# Patient Record
Sex: Female | Born: 1940 | Race: White | Hispanic: No | Marital: Single | Smoking: Current every day smoker
Health system: Southern US, Community
[De-identification: ages and names within clinical notes are randomized; demographics above are authoritative.]

## PROBLEM LIST (undated history)

## (undated) DIAGNOSIS — K759 Inflammatory liver disease, unspecified: Secondary | ICD-10-CM

## (undated) DIAGNOSIS — I639 Cerebral infarction, unspecified: Secondary | ICD-10-CM

## (undated) DIAGNOSIS — I219 Acute myocardial infarction, unspecified: Secondary | ICD-10-CM

## (undated) DIAGNOSIS — I2510 Atherosclerotic heart disease of native coronary artery without angina pectoris: Secondary | ICD-10-CM

## (undated) DIAGNOSIS — R200 Anesthesia of skin: Secondary | ICD-10-CM

## (undated) DIAGNOSIS — J449 Chronic obstructive pulmonary disease, unspecified: Secondary | ICD-10-CM

## (undated) DIAGNOSIS — K922 Gastrointestinal hemorrhage, unspecified: Secondary | ICD-10-CM

## (undated) DIAGNOSIS — I10 Essential (primary) hypertension: Secondary | ICD-10-CM

## (undated) HISTORY — PX: CARDIAC DEFIBRILLATOR PLACEMENT: SHX171

## (undated) HISTORY — PX: OTHER SURGICAL HISTORY: SHX169

## (undated) HISTORY — PX: PACEMAKER INSERTION: SHX728

## (undated) HISTORY — PX: CORONARY ARTERY BYPASS GRAFT: SHX141

## (undated) HISTORY — PX: CHOLECYSTECTOMY: SHX55

---

## 2011-04-10 ENCOUNTER — Inpatient Hospital Stay: Payer: Self-pay | Admitting: Internal Medicine

## 2012-01-10 DIAGNOSIS — K922 Gastrointestinal hemorrhage, unspecified: Secondary | ICD-10-CM

## 2012-01-10 HISTORY — DX: Gastrointestinal hemorrhage, unspecified: K92.2

## 2012-02-09 ENCOUNTER — Encounter (HOSPITAL_BASED_OUTPATIENT_CLINIC_OR_DEPARTMENT_OTHER): Payer: Self-pay | Admitting: *Deleted

## 2012-02-09 ENCOUNTER — Emergency Department (HOSPITAL_BASED_OUTPATIENT_CLINIC_OR_DEPARTMENT_OTHER)
Admission: EM | Admit: 2012-02-09 | Discharge: 2012-02-09 | Disposition: A | Discharge status: Home / Self Care | Payer: Medicare Other | Attending: Emergency Medicine | Admitting: Emergency Medicine

## 2012-02-09 ENCOUNTER — Emergency Department (HOSPITAL_BASED_OUTPATIENT_CLINIC_OR_DEPARTMENT_OTHER): Payer: Medicare Other

## 2012-02-09 DIAGNOSIS — L0390 Cellulitis, unspecified: Secondary | ICD-10-CM

## 2012-02-09 DIAGNOSIS — F17200 Nicotine dependence, unspecified, uncomplicated: Secondary | ICD-10-CM | POA: Insufficient documentation

## 2012-02-09 DIAGNOSIS — J449 Chronic obstructive pulmonary disease, unspecified: Secondary | ICD-10-CM | POA: Insufficient documentation

## 2012-02-09 DIAGNOSIS — L02419 Cutaneous abscess of limb, unspecified: Secondary | ICD-10-CM | POA: Insufficient documentation

## 2012-02-09 DIAGNOSIS — Z7982 Long term (current) use of aspirin: Secondary | ICD-10-CM | POA: Insufficient documentation

## 2012-02-09 DIAGNOSIS — Z951 Presence of aortocoronary bypass graft: Secondary | ICD-10-CM | POA: Insufficient documentation

## 2012-02-09 DIAGNOSIS — I2510 Atherosclerotic heart disease of native coronary artery without angina pectoris: Secondary | ICD-10-CM | POA: Insufficient documentation

## 2012-02-09 DIAGNOSIS — I252 Old myocardial infarction: Secondary | ICD-10-CM | POA: Insufficient documentation

## 2012-02-09 DIAGNOSIS — Z8679 Personal history of other diseases of the circulatory system: Secondary | ICD-10-CM | POA: Insufficient documentation

## 2012-02-09 DIAGNOSIS — E119 Type 2 diabetes mellitus without complications: Secondary | ICD-10-CM | POA: Insufficient documentation

## 2012-02-09 DIAGNOSIS — Z79899 Other long term (current) drug therapy: Secondary | ICD-10-CM | POA: Insufficient documentation

## 2012-02-09 DIAGNOSIS — J4489 Other specified chronic obstructive pulmonary disease: Secondary | ICD-10-CM | POA: Insufficient documentation

## 2012-02-09 HISTORY — DX: Gastrointestinal hemorrhage, unspecified: K92.2

## 2012-02-09 HISTORY — DX: Essential (primary) hypertension: I10

## 2012-02-09 HISTORY — DX: Inflammatory liver disease, unspecified: K75.9

## 2012-02-09 HISTORY — DX: Cerebral infarction, unspecified: I63.9

## 2012-02-09 HISTORY — DX: Acute myocardial infarction, unspecified: I21.9

## 2012-02-09 HISTORY — DX: Chronic obstructive pulmonary disease, unspecified: J44.9

## 2012-02-09 HISTORY — DX: Atherosclerotic heart disease of native coronary artery without angina pectoris: I25.10

## 2012-02-09 HISTORY — DX: Anesthesia of skin: R20.0

## 2012-02-09 MED ORDER — CEPHALEXIN 500 MG PO CAPS: 500.0000 mg | ORAL_CAPSULE | Freq: Four times a day (QID) | ORAL | Status: AC

## 2012-02-09 MED ORDER — CEPHALEXIN 250 MG PO CAPS
500.0000 mg | ORAL_CAPSULE | Freq: Once | ORAL | Status: AC
Start: 2012-02-09 — End: 2012-02-09
  Administered 2012-02-09: 500 mg via ORAL
  Filled 2012-02-09: qty 2

## 2012-02-09 MED ORDER — SULFAMETHOXAZOLE-TMP DS 800-160 MG PO TABS
2.0000 | ORAL_TABLET | Freq: Once | ORAL | Status: AC
  Administered 2012-02-09: 2 via ORAL
  Filled 2012-02-09: qty 2

## 2012-02-09 MED ORDER — SULFAMETHOXAZOLE-TMP DS 800-160 MG PO TABS: 2.0000 | ORAL_TABLET | Freq: Two times a day (BID) | ORAL | Status: AC

## 2012-02-09 NOTE — ED Notes (Signed)
Pain and swelling left lower leg for the last 2 days.  Moderate swelling in foot, with redden area medical lower leg.  Recent discharge from hospital.

## 2012-02-09 NOTE — ED Provider Notes (Signed)
History     CSN: [id number]  Arrival date & time 02/09/12  1713   First MD Initiated Contact with Patient 02/09/12 1813      Chief Complaint  Patient presents with  . Leg Swelling    left    (Consider location/radiation/quality/duration/timing/severity/associated sxs/prior treatment) HPI Patient is a 71 year old female who presents today complaining of 8/10 left lower extremity pain from the knee distally down into the foot. Patient has no history of injury or DVT or PE. She was recently hospitalized and discharged 2 weeks ago for an episode of GI bleeding. She's been told that polyps are found on the colonoscopy but that these were noncancerous. She denies any history of cellulitis or boils. Patient has no history of diabetes. She reports that the pain is worse with palpation of the foot as well as the left medial calf where patient has a small area slightly erythematous area that is 2.5" x 1". Patient lives in [place] and drove 210 miles yesterday to arrive here. Her son forced her to come in as he was worried about a blood clot since he has a history of both DVT and PE. The patient is not on any hormones.  Patient denies any chest pain or shortness of breath. There are no other associated modifying factors. Past Medical History  Diagnosis Date  . Coronary artery disease   . MI (myocardial infarction)     7-8 heart attacks  . Hypertension   . COPD (chronic obstructive pulmonary disease)   . GI bleed July 2013    [place], [place]  . Hepatitis     age16  . Diabetes mellitus   . Stroke   . Left sided numbness     Past Surgical History  Procedure Date  . Coronary artery bypass graft   . Cardiac stents   . Cholecystectomy   . Cardiac defibrillator placement   . Pacemaker insertion     History reviewed. No pertinent family history.  History  Substance Use Topics  . Smoking status: Current Everyday Smoker -- 1.5 packs/day for 57 years    Types: Cigarettes  . Smokeless  tobacco: Not on file  . Alcohol Use: No    OB History    Grav Para Term Preterm Abortions TAB SAB Ect Mult Living                  Review of Systems  Constitutional: Negative.   HENT: Negative.   Eyes: Negative.   Respiratory: Negative.   Cardiovascular: Negative.   Gastrointestinal: Negative.   Genitourinary: Negative.   Musculoskeletal:       Left lower extremity pain  Neurological: Negative.   Hematological: Negative.   Psychiatric/Behavioral: Negative.   All other systems reviewed and are negative.    Allergies  Codeine and Morphine and related  Home Medications   Current Outpatient Rx  Name Route Sig Dispense Refill  . ALBUTEROL SULFATE (2.5 MG/3ML) 0.083% IN NEBU Nebulization Take 2.5 mg by nebulization every 6 (six) hours as needed. For wheezing and shortness of breath.    . ASPIRIN 81 MG PO TABS Oral Take 81 mg by mouth daily.    Marland Kitchen CARVEDILOL 25 MG PO TABS Oral Take 25 mg by mouth 2 (two) times daily with a meal.    . CLOPIDOGREL BISULFATE 75 MG PO TABS Oral Take 75 mg by mouth daily.    . ENALAPRIL MALEATE 20 MG PO TABS Oral Take 20 mg by mouth daily.    Marland Kitchen  FLUTICASONE-SALMETEROL 100-50 MCG/DOSE IN AEPB Inhalation Inhale 2 puffs into the lungs every 12 (twelve) hours. For wheezing.    Marland Kitchen METFORMIN HCL 1000 MG PO TABS Oral Take 1,000 mg by mouth 2 (two) times daily with a meal.    . PANTOPRAZOLE SODIUM 40 MG PO TBEC Oral Take 40 mg by mouth 2 (two) times daily.     Marland Kitchen POTASSIUM CHLORIDE CRYS ER 20 MEQ PO TBCR Oral Take 40 mEq by mouth 2 (two) times daily.    Marland Kitchen TIOTROPIUM BROMIDE MONOHYDRATE 18 MCG IN CAPS Inhalation Place 18 mcg into inhaler and inhale daily.      BP 101/42  Pulse 70  Temp 98.4 F (36.9 C) (Oral)  Resp 20  Ht 5\' 5"  (1.651 m)  Wt 178 lb (80.74 kg)  BMI 29.62 kg/m2  SpO2 100%  Physical Exam  Nursing note and vitals reviewed. GEN: Well-developed, well-nourished female in no distress HEENT: Atraumatic, normocephalic. Oropharynx clear  without erythema EYES: PERRLA BL, no scleral icterus. NECK: Trachea midline, no meningismus CV: regular rate and rhythm. No murmurs, rubs, or gallops PULM: No respiratory distress.  No crackles, wheezes, or rales. GI: soft, non-tender. No guarding, rebound, or tenderness. + bowel sounds  GU: deferred MSK: Patient has tenderness to palpation over the left medial calf just distal to the knee over an area that is slightly erythematous and 2.5" x 1". Patient also has tenderness to palpation over the dorsum of the left foot. Dorsalis pedis and posterior tibialis pulses are one plus. There is minimal swelling compared to other lower extremity. Patient also complains of some posterior left calf tenderness but has no palpable cord. Skin: No petechiae, purpura, or jaundice. Patient with small erythematous area noted over the left medial calf as noted in the musculoskeletal exam. Psych: no abnormality of mood   ED Course  Procedures (including critical care time)  Labs Reviewed - No data to display US Venous Img Lower Unilateral Left  02/09/2012  *RADIOLOGY REPORT*  Clinical Data: Left lower leg pain and swelling for 2 days.  LEFT LOWER EXTREMITY VENOUS DUPLEX ULTRASOUND  Technique:  Gray-scale sonography with graded compression, as well as color Doppler and duplex ultrasound, were performed to evaluate the deep venous system of the lower extremity from the level of the common femoral vein through the popliteal and proximal calf veins. Spectral Doppler was utilized to evaluate flow at rest and with distal augmentation maneuvers.  Comparison:  None.  Findings: There is no evidence of DVT in the lower extremity. Normal phasicity, augmentation, and compressibility in the saphenofemoral junction, common femoral vein, femoral vein, deep femoral vein, and popliteal vein.  IMPRESSION: Negative left lower extremity DVT study.  Original Report Authenticated By: Andreas Newport, M.D.     1. Cellulitis       MDM   Patient was evaluated by myself. Based on evaluation patient was not having chest pain or shortness of breath and she was hemodynamically stable. Ultrasound of the left lower short he was performed to evaluate for DVT. The area was minimally concerning for cellulitis but given its appearance this is the most likely other diagnosis. Ultrasound returned with no evidence of DVT. Patient will be treated for cellulitis. She was given her first dose of Keflex and Bactrim here. She was discharged with 10 days of both. Patient was advised to followup with her primary care physician. If she develops worsening of her symptoms or fevers, nausea, or vomiting despite antibiotics she should return to the emergency  department to be seen.        Cyndra Numbers, MD 02/11/12 0500

## 2014-07-09 ENCOUNTER — Emergency Department (HOSPITAL_BASED_OUTPATIENT_CLINIC_OR_DEPARTMENT_OTHER)
Admission: EM | Admit: 2014-07-09 | Discharge: 2014-07-09 | Disposition: A | Discharge status: Home / Self Care | Payer: Medicare Other | Attending: Emergency Medicine | Admitting: Emergency Medicine

## 2014-07-09 ENCOUNTER — Encounter (HOSPITAL_BASED_OUTPATIENT_CLINIC_OR_DEPARTMENT_OTHER): Payer: Self-pay | Admitting: *Deleted

## 2014-07-09 ENCOUNTER — Emergency Department (HOSPITAL_BASED_OUTPATIENT_CLINIC_OR_DEPARTMENT_OTHER): Payer: Medicare Other

## 2014-07-09 DIAGNOSIS — Z7951 Long term (current) use of inhaled steroids: Secondary | ICD-10-CM | POA: Insufficient documentation

## 2014-07-09 DIAGNOSIS — I2510 Atherosclerotic heart disease of native coronary artery without angina pectoris: Secondary | ICD-10-CM | POA: Insufficient documentation

## 2014-07-09 DIAGNOSIS — S0990XA Unspecified injury of head, initial encounter: Secondary | ICD-10-CM | POA: Insufficient documentation

## 2014-07-09 DIAGNOSIS — S0003XA Contusion of scalp, initial encounter: Secondary | ICD-10-CM | POA: Diagnosis not present

## 2014-07-09 DIAGNOSIS — M542 Cervicalgia: Secondary | ICD-10-CM

## 2014-07-09 DIAGNOSIS — Y9289 Other specified places as the place of occurrence of the external cause: Secondary | ICD-10-CM | POA: Insufficient documentation

## 2014-07-09 DIAGNOSIS — Z8673 Personal history of transient ischemic attack (TIA), and cerebral infarction without residual deficits: Secondary | ICD-10-CM | POA: Insufficient documentation

## 2014-07-09 DIAGNOSIS — I10 Essential (primary) hypertension: Secondary | ICD-10-CM | POA: Insufficient documentation

## 2014-07-09 DIAGNOSIS — Z8719 Personal history of other diseases of the digestive system: Secondary | ICD-10-CM | POA: Insufficient documentation

## 2014-07-09 DIAGNOSIS — Z720 Tobacco use: Secondary | ICD-10-CM | POA: Diagnosis not present

## 2014-07-09 DIAGNOSIS — Z7982 Long term (current) use of aspirin: Secondary | ICD-10-CM | POA: Insufficient documentation

## 2014-07-09 DIAGNOSIS — I252 Old myocardial infarction: Secondary | ICD-10-CM | POA: Insufficient documentation

## 2014-07-09 DIAGNOSIS — Z95810 Presence of automatic (implantable) cardiac defibrillator: Secondary | ICD-10-CM | POA: Diagnosis not present

## 2014-07-09 DIAGNOSIS — E119 Type 2 diabetes mellitus without complications: Secondary | ICD-10-CM | POA: Diagnosis not present

## 2014-07-09 DIAGNOSIS — Z9889 Other specified postprocedural states: Secondary | ICD-10-CM | POA: Diagnosis not present

## 2014-07-09 DIAGNOSIS — J449 Chronic obstructive pulmonary disease, unspecified: Secondary | ICD-10-CM | POA: Diagnosis not present

## 2014-07-09 DIAGNOSIS — Z79899 Other long term (current) drug therapy: Secondary | ICD-10-CM | POA: Insufficient documentation

## 2014-07-09 DIAGNOSIS — S199XXA Unspecified injury of neck, initial encounter: Secondary | ICD-10-CM | POA: Diagnosis not present

## 2014-07-09 DIAGNOSIS — Z7902 Long term (current) use of antithrombotics/antiplatelets: Secondary | ICD-10-CM | POA: Diagnosis not present

## 2014-07-09 DIAGNOSIS — Y9389 Activity, other specified: Secondary | ICD-10-CM | POA: Insufficient documentation

## 2014-07-09 DIAGNOSIS — W1789XA Other fall from one level to another, initial encounter: Secondary | ICD-10-CM | POA: Diagnosis not present

## 2014-07-09 DIAGNOSIS — Z955 Presence of coronary angioplasty implant and graft: Secondary | ICD-10-CM | POA: Insufficient documentation

## 2014-07-09 DIAGNOSIS — Y998 Other external cause status: Secondary | ICD-10-CM | POA: Insufficient documentation

## 2014-07-09 LAB — BASIC METABOLIC PANEL
ANION GAP: 9 (ref 5–15)
BUN: 16 mg/dL (ref 6–23)
CALCIUM: 9.2 mg/dL (ref 8.4–10.5)
CO2: 33 mmol/L — AB (ref 19–32)
CREATININE: 0.98 mg/dL (ref 0.50–1.10)
Chloride: 95 mEq/L — ABNORMAL LOW (ref 96–112)
GFR calc Af Amer: 65 mL/min — ABNORMAL LOW (ref >90)
GFR calc non Af Amer: 56 mL/min — ABNORMAL LOW (ref >90)
GLUCOSE: 128 mg/dL — AB (ref 70–99)
Potassium: 4 mmol/L (ref 3.5–5.1)
Sodium: 137 mmol/L (ref 135–145)

## 2014-07-09 LAB — CBC
HCT: 37.9 % (ref 36.0–46.0)
Hemoglobin: 12.1 g/dL (ref 12.0–15.0)
MCH: 27.8 pg (ref 26.0–34.0)
MCHC: 31.9 g/dL (ref 30.0–36.0)
MCV: 86.9 fL (ref 78.0–100.0)
PLATELETS: 303 10*3/uL (ref 150–400)
RBC: 4.36 MIL/uL (ref 3.87–5.11)
RDW: 16.8 % — ABNORMAL HIGH (ref 11.5–15.5)
WBC: 10.1 10*3/uL (ref 4.0–10.5)

## 2014-07-09 MED ORDER — HYDROCODONE-ACETAMINOPHEN 5-325 MG PO TABS
1.0000 | ORAL_TABLET | Freq: Once | ORAL | Status: AC
  Administered 2014-07-09: 1 via ORAL
  Filled 2014-07-09: qty 2

## 2014-07-09 NOTE — ED Notes (Signed)
MD at bedside. 

## 2014-07-09 NOTE — ED Notes (Signed)
Pt family member to desk-states pt did not want pain med earlier but is requesting med now-states pt takes percocet for chronic pain-EDP notified

## 2014-07-09 NOTE — ED Notes (Signed)
Pt given ice pack for forehead-states she doubts she will use it

## 2014-07-09 NOTE — ED Notes (Signed)
Pt refused 2 tabs hydrocodone-stating she only wanted to take one tab on an empty stomach-2nd tab returned to pyxis

## 2014-07-09 NOTE — Discharge Instructions (Signed)
Use ice for swelling, tylenol for pain.  If you were given medicines take as directed.  If you are on coumadin or contraceptives realize their levels and effectiveness is altered by many different medicines.  If you have any reaction (rash, tongues swelling, other) to the medicines stop taking and see a physician.   Please follow up as directed and return to the ER or see a physician for new or worsening symptoms.  Thank you. Filed Vitals:   07/09/14 1852  BP: 178/82  Pulse: 80  Temp: 97.9 F (36.6 C)  Resp: 16  Height: 5\' 5"  (1.651 m)  Weight: 190 lb (86.183 kg)  SpO2: 92%

## 2014-07-09 NOTE — ED Provider Notes (Signed)
CSN: [id number]     Arrival date & time 07/09/14  1848 History  This chart was scribed for Enid SkeensJoshua M Jakhi Dishman, MD by Roxy Cedarhandni Bhalodia, ED Scribe. This patient was seen in room MH06/MH06 and the patient's care was started at 7:29 PM.   Chief Complaint  Patient presents with  . Head Injury   Patient is a 73 y.o. female presenting with head injury. The history is provided by the patient. No language interpreter was used.  Head Injury  HPI Comments: Nicole Morales is a 73 y.o. female with a PMHx of diabetes, CAD, MI, hypertension, neuropathy, GI bleed, stroke, cholecystectomy, coronary artery bypass graft, who presents to the Emergency Department complaining of head injury that occurred earlier today when patient fell from the sofa onto the carpet floor on her head. She states she fell asleep while sitting on the edge of the sofa. She reports associated neck pain. She denies chest pain, shortness of breath, weakness of arms and legs greater than at baseline. Patient is currently taking Plavix and asa. Per son, patient has fallen three times in the past 3 weeks.  Past Medical History  Diagnosis Date  . Coronary artery disease   . MI (myocardial infarction)     7-8 heart attacks  . Hypertension   . COPD (chronic obstructive pulmonary disease)   . GI bleed July 2013    [place], [place]  . Hepatitis     age16  . Diabetes mellitus   . Stroke   . Left sided numbness    Past Surgical History  Procedure Laterality Date  . Coronary artery bypass graft    . Cardiac stents    . Cholecystectomy    . Cardiac defibrillator placement    . Pacemaker insertion     History reviewed. No pertinent family history. History  Substance Use Topics  . Smoking status: Current Every Day Smoker -- 1.50 packs/day for 57 years    Types: Cigarettes  . Smokeless tobacco: Not on file  . Alcohol Use: No   OB History    No data available     Review of Systems  Skin: Positive for wound.  All other systems reviewed  and are negative.  Allergies  Codeine and Morphine and related  Home Medications   Prior to Admission medications   Medication Sig Start Date End Date Taking? Authorizing Provider  albuterol (PROVENTIL) (2.5 MG/3ML) 0.083% nebulizer solution Take 2.5 mg by nebulization every 6 (six) hours as needed. For wheezing and shortness of breath.    Historical Provider, MD  aspirin 81 MG tablet Take 81 mg by mouth daily.    Historical Provider, MD  carvedilol (COREG) 25 MG tablet Take 25 mg by mouth 2 (two) times daily with a meal.    Historical Provider, MD  clopidogrel (PLAVIX) 75 MG tablet Take 75 mg by mouth daily.    Historical Provider, MD  enalapril (VASOTEC) 20 MG tablet Take 20 mg by mouth daily.    Historical Provider, MD  Fluticasone-Salmeterol (ADVAIR) 100-50 MCG/DOSE AEPB Inhale 2 puffs into the lungs every 12 (twelve) hours. For wheezing.    Historical Provider, MD  metFORMIN (GLUCOPHAGE) 1000 MG tablet Take 1,000 mg by mouth 2 (two) times daily with a meal.    Historical Provider, MD  pantoprazole (PROTONIX) 40 MG tablet Take 40 mg by mouth 2 (two) times daily.     Historical Provider, MD  potassium chloride SA (K-DUR,KLOR-CON) 20 MEQ tablet Take 40 mEq by mouth 2 (two) times  daily.    Historical Provider, MD  tiotropium (SPIRIVA) 18 MCG inhalation capsule Place 18 mcg into inhaler and inhale daily.    Historical Provider, MD   Triage Vitals: BP 178/82 mmHg  Pulse 80  Temp(Src) 97.9 F (36.6 C)  Resp 16  Ht 5\' 5"  (1.651 m)  Wt 190 lb (86.183 kg)  BMI 31.62 kg/m2  SpO2 92%  Physical Exam  Constitutional: She is oriented to person, place, and time. She appears well-developed and well-nourished. No distress.  HENT:  Head: Normocephalic.  Swollen hematoma 4cm in diameter. Mild superficial blood without active bleeding. Superficial abrasion to the right lateral aspect of temporal region.  Eyes: Conjunctivae and EOM are normal. Pupils are equal, round, and reactive to light. Right  eye exhibits no discharge. Left eye exhibits no discharge.  Neck: Normal range of motion. Neck supple. No tracheal deviation present.  TTP paracervical region.  Cardiovascular: Normal rate and regular rhythm.   Pulmonary/Chest: Effort normal and breath sounds normal. No respiratory distress.  Abdominal: Soft. She exhibits no distension. There is no tenderness. There is no guarding.  No bruising seen.  Musculoskeletal: Normal range of motion. She exhibits no edema.  No TTP to midline thoracic spine.  Neurological: She is alert and oriented to person, place, and time.  Equal strength in arms. No arm drift. Mild swelling noted to legs. 1+ DP pulse.  Skin: Skin is warm and dry. No rash noted.  Psychiatric: She has a normal mood and affect. Her behavior is normal.  Nursing note and vitals reviewed.  ED Course  Procedures (including critical care time)  DIAGNOSTIC STUDIES: Oxygen Saturation is 92% on RA, low by my interpretation.    COORDINATION OF CARE: 7:34 PM- Discussed plans to order diagnostic lab work and CT imaging of head. Pt advised of plan for treatment and pt agrees.  Labs Review Labs Reviewed  CBC - Abnormal; Notable for the following:    RDW 16.8 (*)    All other components within normal limits  BASIC METABOLIC PANEL - Abnormal; Notable for the following:    Chloride 95 (*)    CO2 33 (*)    Glucose, Bld 128 (*)    GFR calc non Af Amer 56 (*)    GFR calc Af Amer 65 (*)    All other components within normal limits    Imaging Review Ct Head Wo Contrast  07/09/2014   CLINICAL DATA:  73 year old female with history of trauma from a fall from a chair, with injury to the forehead chronic carpeted floor. Abrasion swelling on the right side of the forehead.  EXAM: CT HEAD WITHOUT CONTRAST  TECHNIQUE: Contiguous axial images were obtained from the base of the skull through the vertex without intravenous contrast.  COMPARISON:  No priors.  FINDINGS: Small amount of high  attenuation soft tissue swelling in the right frontal scalp, compatible with a tiny scalp hematoma. No acute displaced skull fractures are identified. No acute intracranial abnormality. Specifically, no evidence of acute post-traumatic intracranial hemorrhage, no definite regions of acute/subacute cerebral ischemia, no focal mass, mass effect, hydrocephalus or abnormal intra or extra-axial fluid collections. Patchy areas of decreased attenuation are noted throughout the deep and periventricular white matter of the cerebral hemispheres bilaterally, compatible with chronic microvascular ischemic disease. The visualized paranasal sinuses and mastoids are well pneumatized.  IMPRESSION: 1. Small right frontal scalp hematoma. No underlying displaced skull fracture or evidence of significant acute traumatic injury to the brain. 2. Mild chronic microvascular  ischemic changes in the cerebral white matter, as above.   Electronically Signed   By: Trudie Reed M.D.   On: 07/09/2014 19:34   Ct Cervical Spine Wo Contrast  07/09/2014   CLINICAL DATA:  Fall, frontal head injury, bilateral neck pain radiating shoulders  EXAM: CT CERVICAL SPINE WITHOUT CONTRAST  TECHNIQUE: Multidetector CT imaging of the cervical spine was performed without intravenous contrast. Multiplanar CT image reconstructions were also generated.  COMPARISON:  None.  FINDINGS: Normal cervical lordosis.  No evidence of fracture dislocation. Vertebral body heights and intervertebral disc spaces are maintained. Dens appears intact.  Mild degenerative changes, most prominent at C5-6 and C6-7.  No prevertebral soft tissue swelling.  Visualized right thyroid is mildly heterogeneous/nodular.  Visualized lung apices are notable for mild paraseptal emphysematous changes.  IMPRESSION: No evidence of traumatic injury to the cervical spine.  Mild degenerative changes.   Electronically Signed   By: Charline Bills M.D.   On: 07/09/2014 20:47     EKG  Interpretation None     MDM   Final diagnoses:  Neck pain, acute  Acute head injury, initial encounter  Scalp hematoma, initial encounter   Patient with recurrent falls, patient has home health arranged for assistance. Mechanical fall, screening blood work done, no acute findings.  CT head and neck results reviewed no acute findings. Results and differential diagnosis were discussed with the patient/parent/guardian. Close follow up outpatient was discussed, comfortable with the plan.   Medications - No data to display  Filed Vitals:   07/09/14 1852  BP: 178/82  Pulse: 80  Temp: 97.9 F (36.6 C)  Resp: 16  Height: 5\' 5"  (1.651 m)  Weight: 190 lb (86.183 kg)  SpO2: 92%    Final diagnoses:  Neck pain, acute  Acute head injury, initial encounter  Scalp hematoma, initial encounter      I personally performed the services described in this documentation, which was scribed in my presence. The recorded information has been reviewed and is accurate.  Enid Skeens, MD 07/09/14 2112

## 2014-07-09 NOTE — ED Notes (Signed)
Pt fell head first from sofa landing on carpet floor , hematoma to forehead , pt is on plavix and asa.

## 2015-05-19 IMAGING — CT CT HEAD W/O CM
1 series · 15 of 30 positions shown, 19 images · non-contrast
Comparison: No priors.

CLINICAL DATA: 73-year-old female with history of trauma from a
fall from a chair, with injury to the forehead chronic carpeted
floor. Abrasion swelling on the right side of the forehead.

EXAM:
CT HEAD WITHOUT CONTRAST
TECHNIQUE: Contiguous axial images were obtained from the base of the skull
through the vertex without intravenous contrast.

[Series 2: head 4.8 h37s · axial · 0.45mm/px · z∈[-172,-12]mm · 15 of 36 slices shown, 19 images]
[im 2/36  brain]
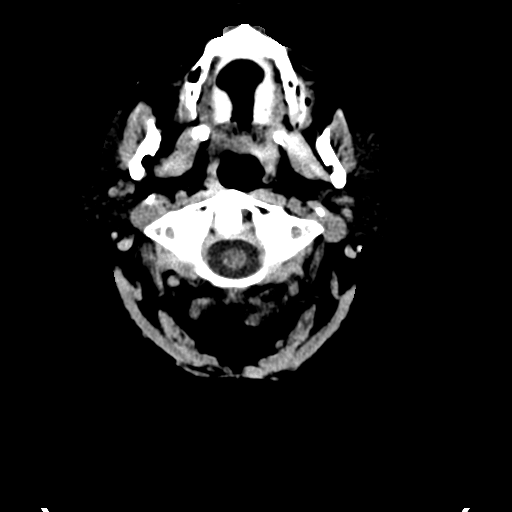
[im 2/36  bone]
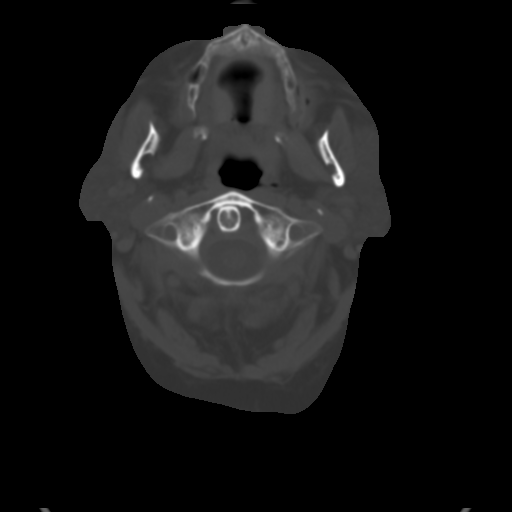
[im 4/36  brain]
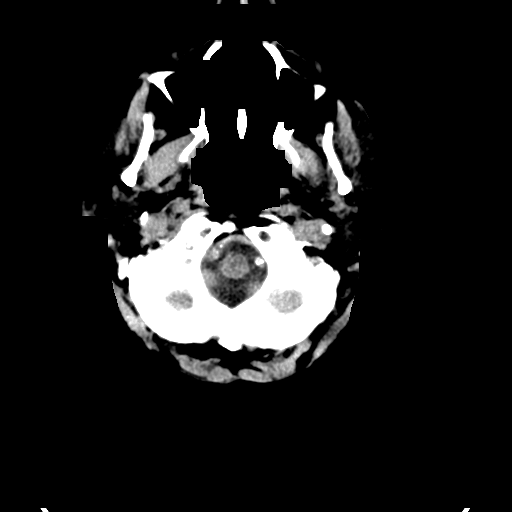
[im 7/36  brain]
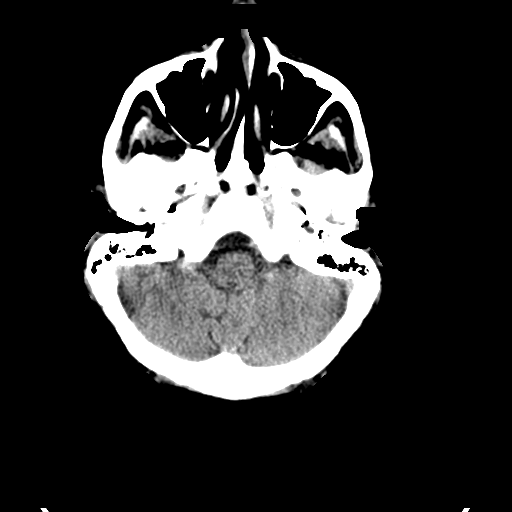
[im 9/36  brain]
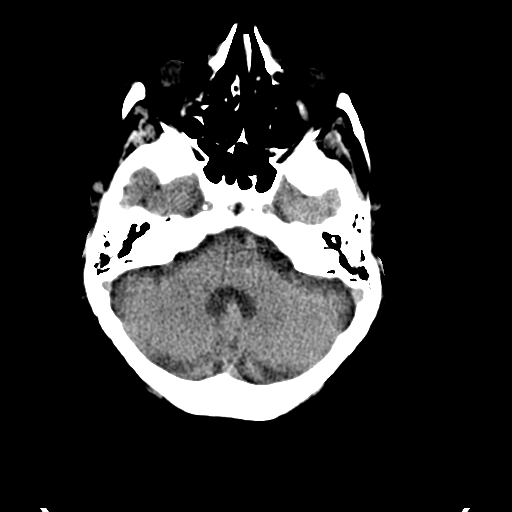
[im 11/36  brain]
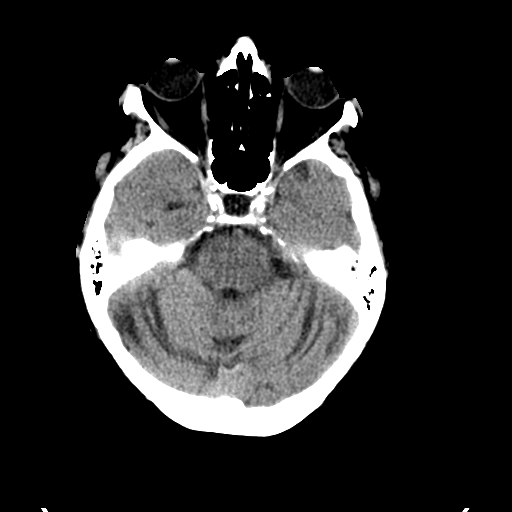
[im 11/36  bone]
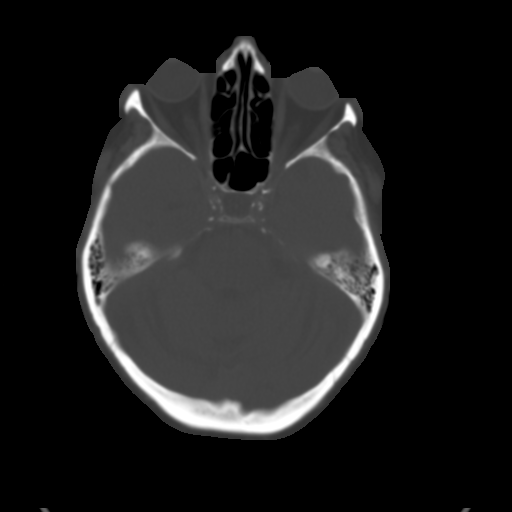
[im 14/36  brain]
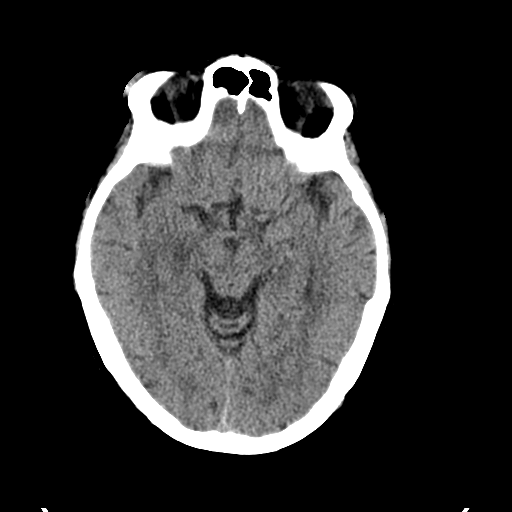
[im 16/36  brain]
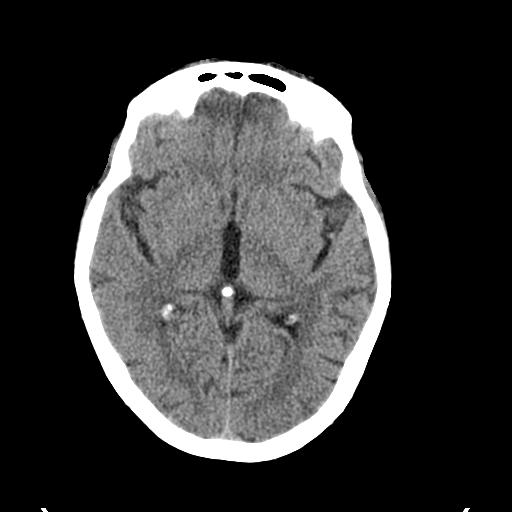
[im 19/36  brain]
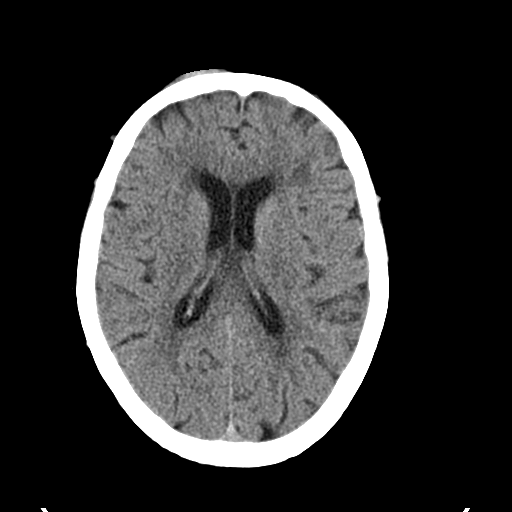
[im 20/36  brain]
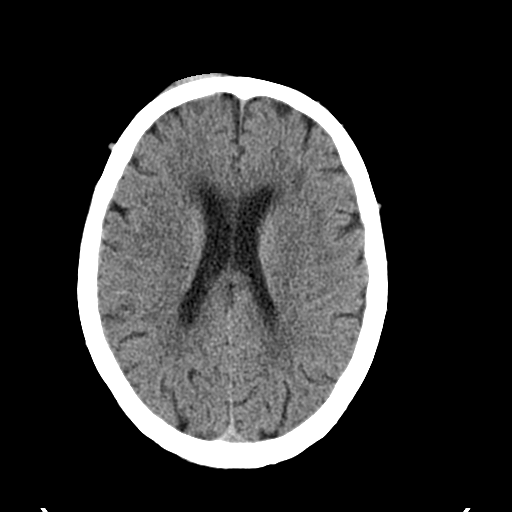
[im 20/36  bone]
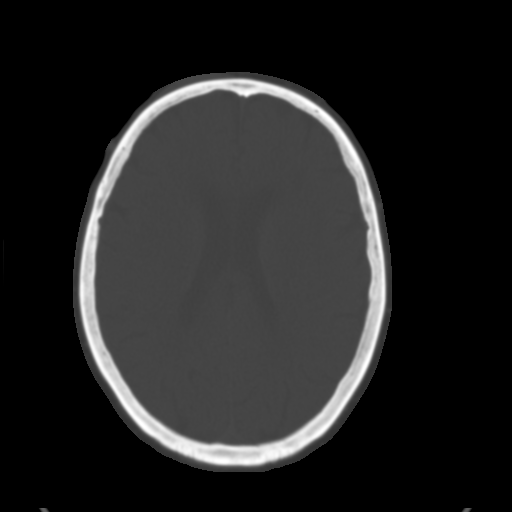
[im 22/36  brain]
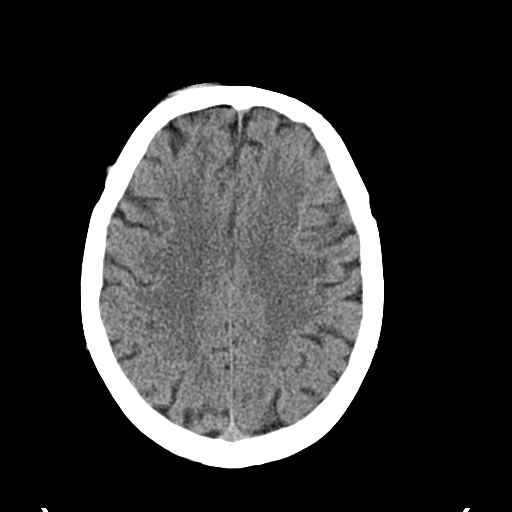
[im 25/36  brain]
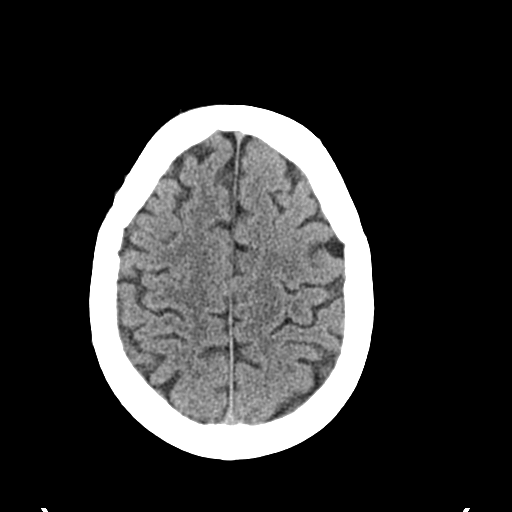
[im 27/36  brain]
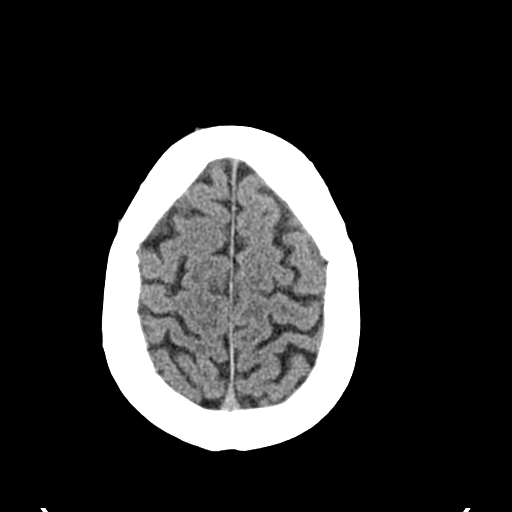
[im 29/36  brain]
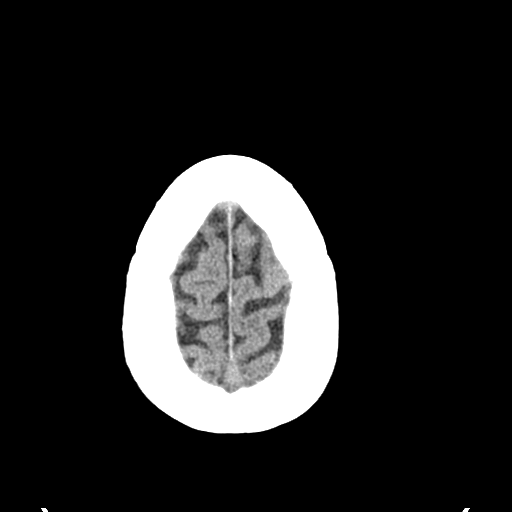
[im 29/36  bone]
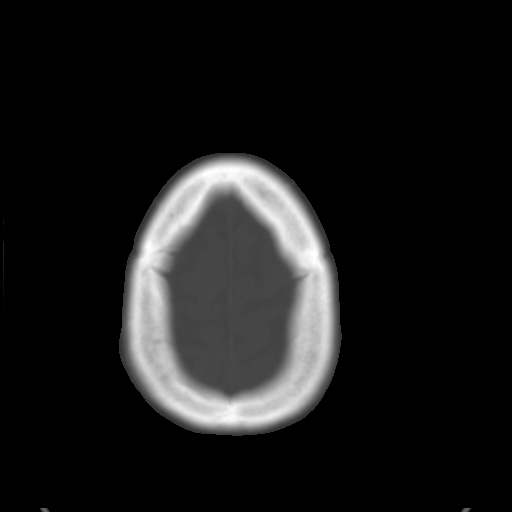
[im 32/36  brain]
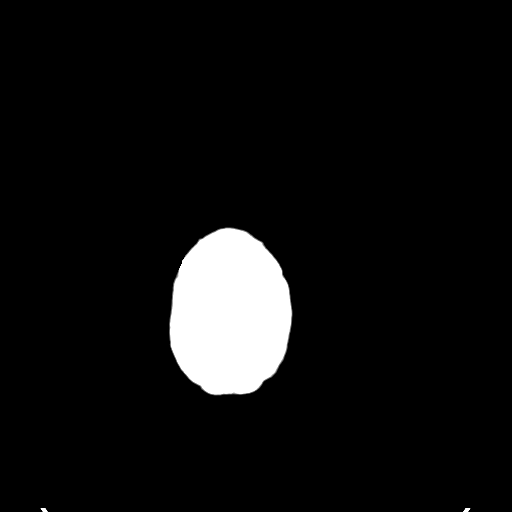
[im 34/36  brain]
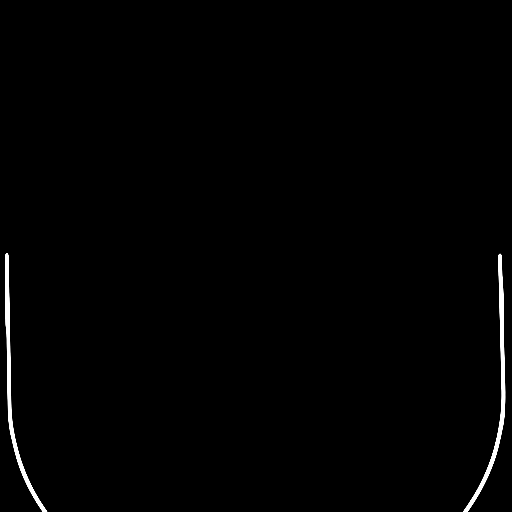

[15 of 30 positions shown; findings below may reference images not displayed]

FINDINGS: Small amount of high attenuation soft tissue swelling in the right
frontal scalp, compatible with a tiny scalp hematoma. No acute
displaced skull fractures are identified. No acute intracranial
abnormality. Specifically, no evidence of acute post-traumatic
intracranial hemorrhage, no definite regions of acute/subacute
cerebral ischemia, no focal mass, mass effect, hydrocephalus or
abnormal intra or extra-axial fluid collections. Patchy areas of
decreased attenuation are noted throughout the deep and
periventricular white matter of the cerebral hemispheres
bilaterally, compatible with chronic microvascular ischemic disease.
The visualized paranasal sinuses and mastoids are well pneumatized.
IMPRESSION: 1. Small right frontal scalp hematoma. No underlying displaced skull
fracture or evidence of significant acute traumatic injury to the
brain.
2. Mild chronic microvascular ischemic changes in the cerebral white
matter, as above.

## 2015-05-19 IMAGING — CT CT CERVICAL SPINE W/O CM
3 of 4 series · 13 of 33 positions shown, 16 images · non-contrast
Comparison: None.

CLINICAL DATA: Fall, frontal head injury, bilateral neck pain
radiating shoulders

EXAM:
CT CERVICAL SPINE WITHOUT CONTRAST
TECHNIQUE: Multidetector CT imaging of the cervical spine was performed without
intravenous contrast. Multiplanar CT image reconstructions were also
generated.

[Series 3: c_spine 2.0 b41s st · axial · 0.30mm/px · z∈[-212,-114]mm · 5 of 75 slices shown, 7 images]
[im 13/75  soft-tissue]
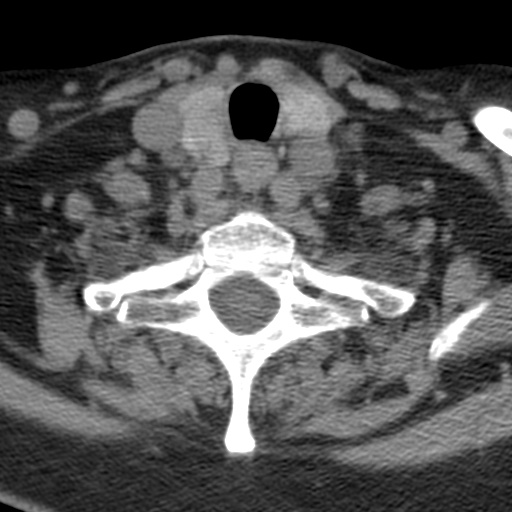
[im 13/75  bone]
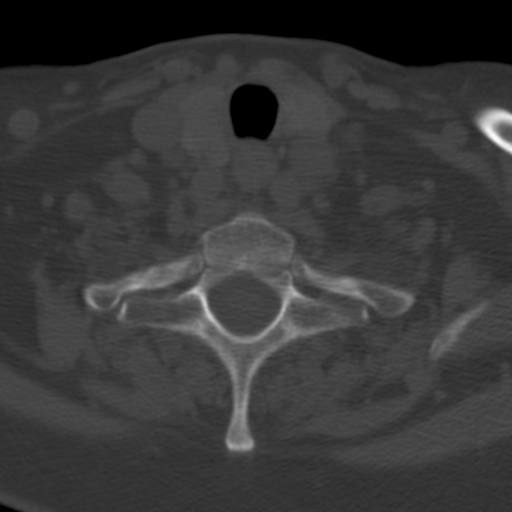
[im 25/75  bone]
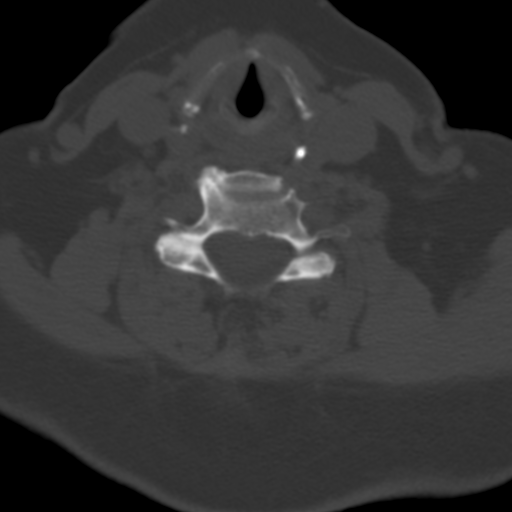
[im 38/75  bone]
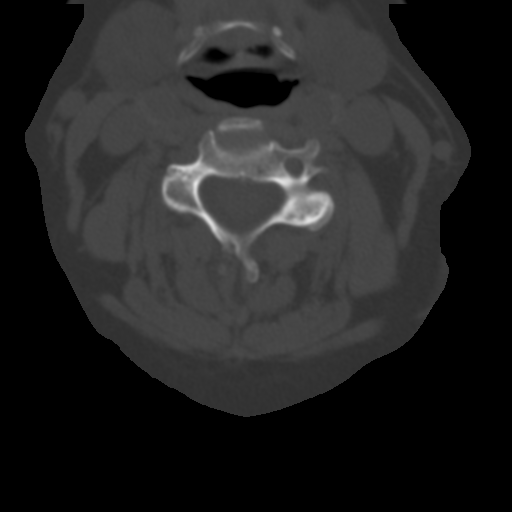
[im 50/75  bone]
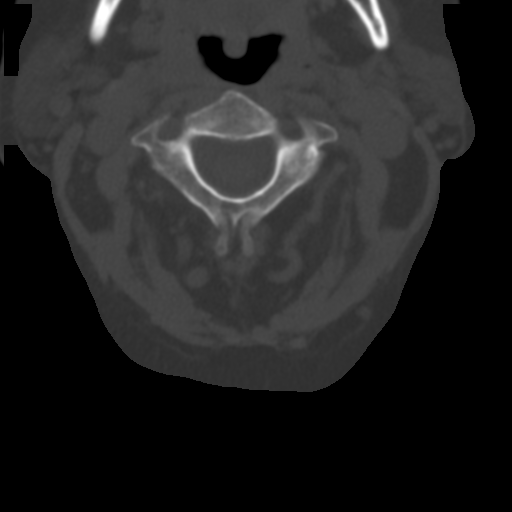
[im 62/75  soft-tissue]
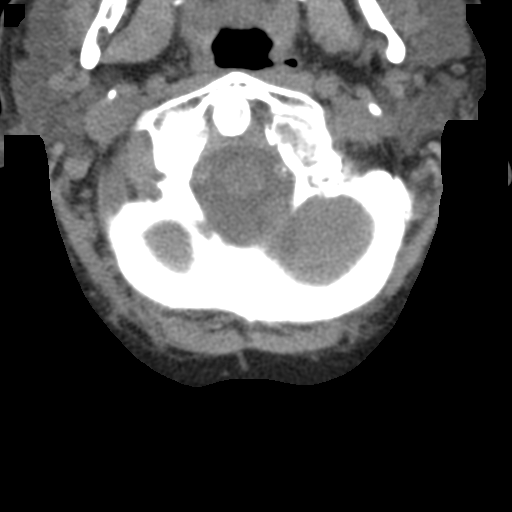
[im 62/75  bone]
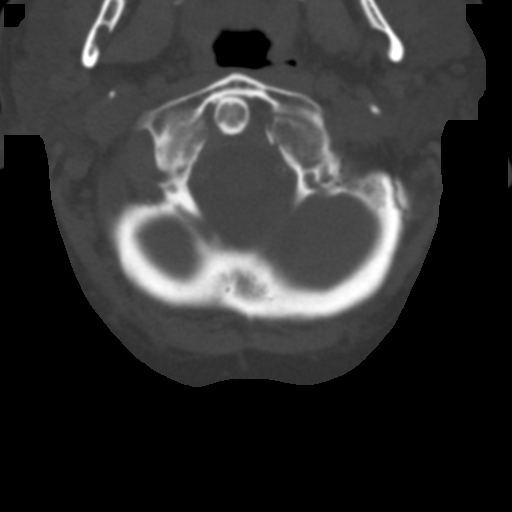

[Series 6: c_spine 2.0 coronal · coronal · 0.23mm/px · 3 of 45 slices shown]
[im 9/45  bone]
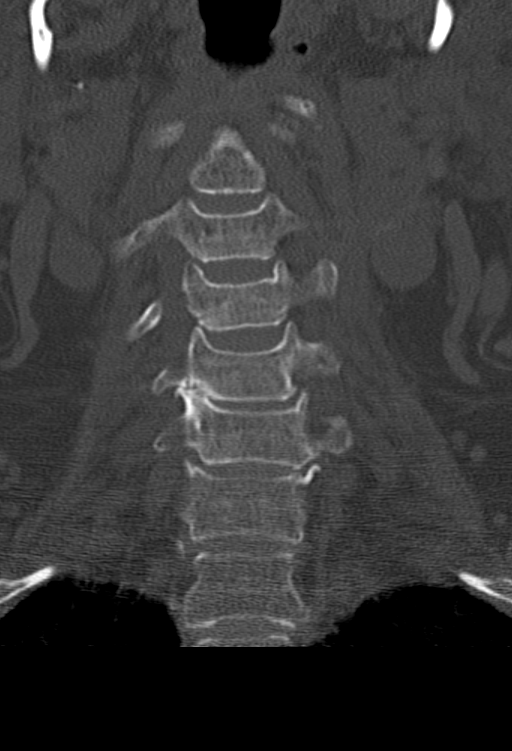
[im 18/45  bone]
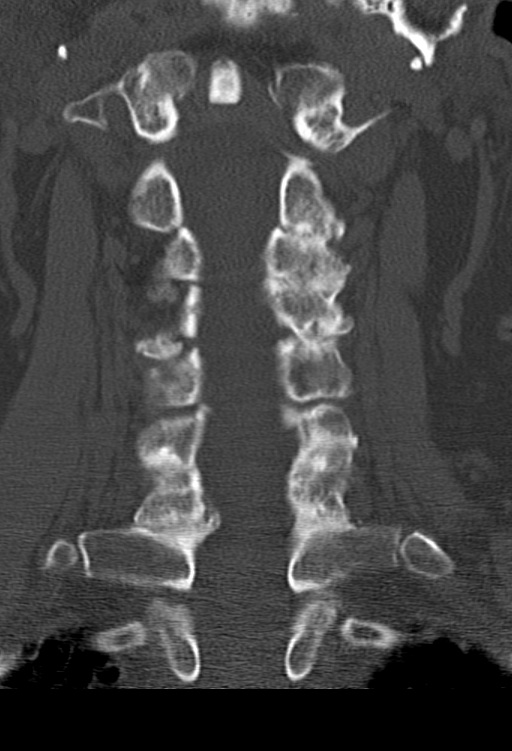
[im 27/45  bone]
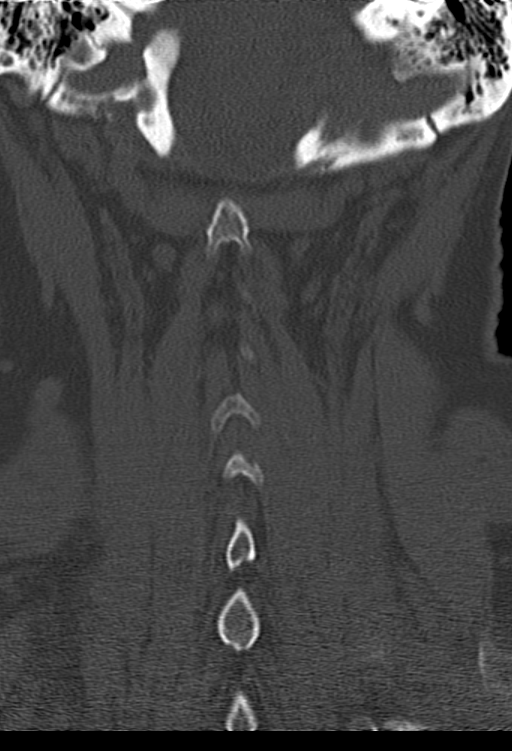

[Series 7: c_spine 2.0 sagittal · sagittal · 0.24mm/px · 5 of 41 slices shown, 6 images]
[im 14/41  bone]
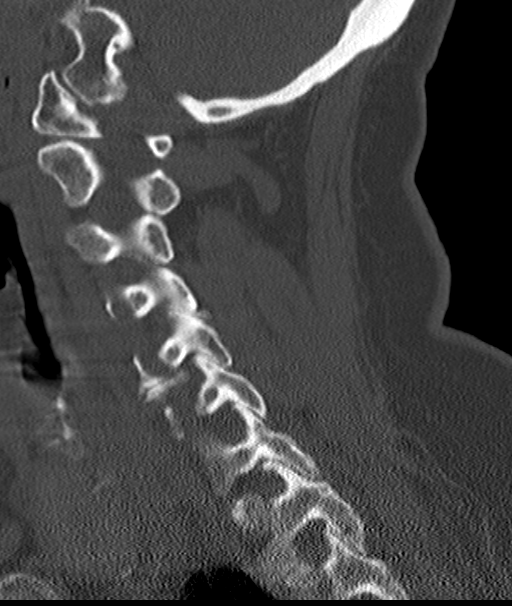
[im 17/41  bone]
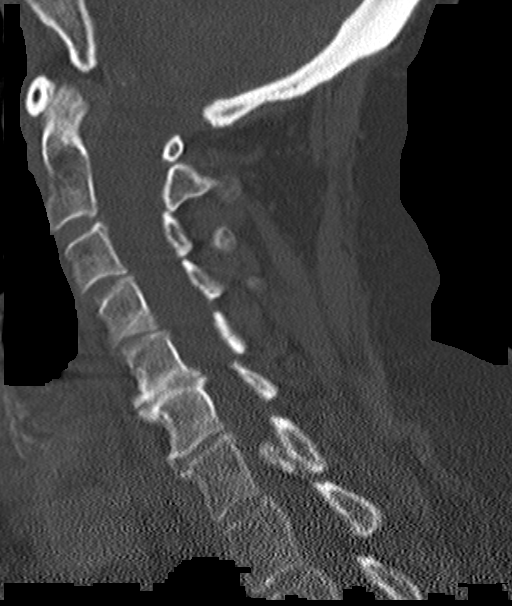
[im 21/41  soft-tissue]
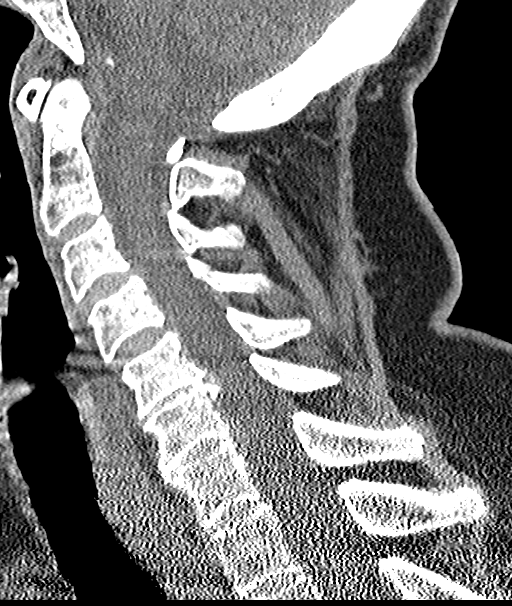
[im 21/41  bone]
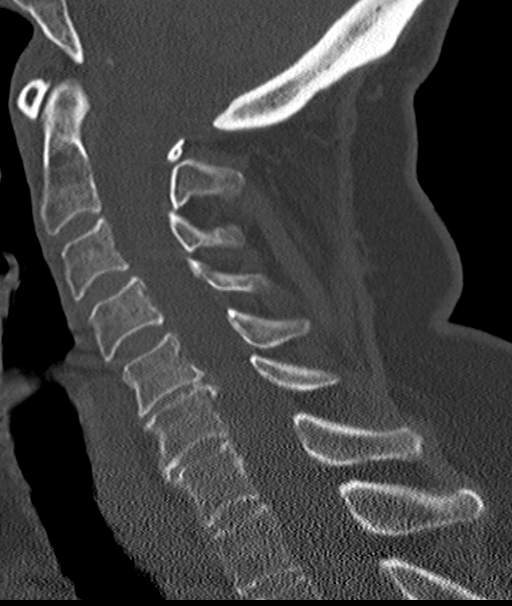
[im 24/41  bone]
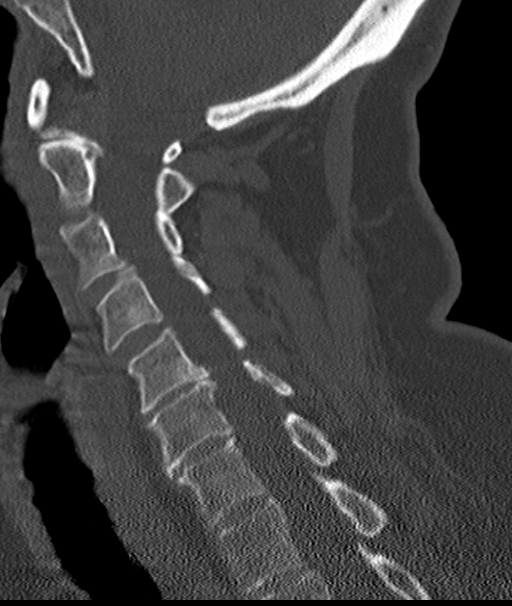
[im 27/41  bone]
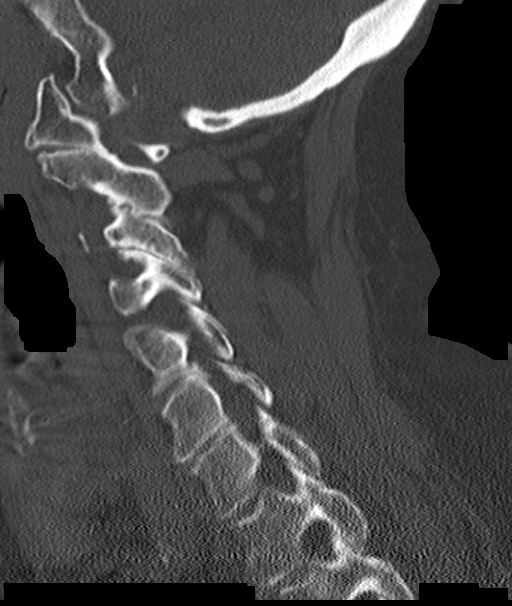

[13 of 33 positions shown; findings below may reference images not displayed]

FINDINGS: Normal cervical lordosis.

No evidence of fracture dislocation. Vertebral body heights and
intervertebral disc spaces are maintained. Dens appears intact.

Mild degenerative changes, most prominent at C5-6 and C6-7.

No prevertebral soft tissue swelling.

Visualized right thyroid is mildly heterogeneous/nodular.

Visualized lung apices are notable for mild paraseptal emphysematous
changes.
IMPRESSION: No evidence of traumatic injury to the cervical spine.

Mild degenerative changes.

## 2021-08-12 DEATH — deceased
